# Patient Record
Sex: Female | Born: 1975 | Race: White | Hispanic: No | Marital: Married | Smoking: Never smoker
Health system: Southern US, Community
[De-identification: ages and names within clinical notes are randomized; demographics above are authoritative.]

## PROBLEM LIST (undated history)

## (undated) DIAGNOSIS — K222 Esophageal obstruction: Secondary | ICD-10-CM

## (undated) DIAGNOSIS — K8020 Calculus of gallbladder without cholecystitis without obstruction: Secondary | ICD-10-CM

## (undated) DIAGNOSIS — M1990 Unspecified osteoarthritis, unspecified site: Secondary | ICD-10-CM

## (undated) DIAGNOSIS — F419 Anxiety disorder, unspecified: Secondary | ICD-10-CM

## (undated) DIAGNOSIS — Z87448 Personal history of other diseases of urinary system: Secondary | ICD-10-CM

## (undated) DIAGNOSIS — K219 Gastro-esophageal reflux disease without esophagitis: Secondary | ICD-10-CM

## (undated) DIAGNOSIS — N6019 Diffuse cystic mastopathy of unspecified breast: Secondary | ICD-10-CM

## (undated) HISTORY — PX: LAPAROSCOPIC CHOLECYSTECTOMY: SUR755

## (undated) HISTORY — DX: Personal history of other diseases of urinary system: Z87.448

## (undated) HISTORY — DX: Esophageal obstruction: K22.2

## (undated) HISTORY — DX: Gastro-esophageal reflux disease without esophagitis: K21.9

## (undated) HISTORY — DX: Unspecified osteoarthritis, unspecified site: M19.90

## (undated) HISTORY — DX: Calculus of gallbladder without cholecystitis without obstruction: K80.20

## (undated) HISTORY — PX: ABDOMINAL EXPLORATION SURGERY: SHX538

## (undated) HISTORY — DX: Anxiety disorder, unspecified: F41.9

---

## 2003-02-06 ENCOUNTER — Ambulatory Visit (HOSPITAL_COMMUNITY): Admission: RE | Admit: 2003-02-06 | Discharge: 2003-02-06 | Payer: Self-pay | Admitting: Internal Medicine

## 2004-03-16 ENCOUNTER — Ambulatory Visit (HOSPITAL_COMMUNITY): Admission: RE | Admit: 2004-03-16 | Discharge: 2004-03-16 | Payer: Self-pay | Admitting: Urology

## 2004-03-16 ENCOUNTER — Encounter (INDEPENDENT_AMBULATORY_CARE_PROVIDER_SITE_OTHER): Payer: Self-pay | Admitting: *Deleted

## 2004-03-16 ENCOUNTER — Ambulatory Visit (HOSPITAL_BASED_OUTPATIENT_CLINIC_OR_DEPARTMENT_OTHER): Admission: RE | Admit: 2004-03-16 | Discharge: 2004-03-16 | Payer: Self-pay | Admitting: Urology

## 2004-11-27 ENCOUNTER — Emergency Department (HOSPITAL_COMMUNITY): Admission: EM | Admit: 2004-11-27 | Discharge: 2004-11-27 | Payer: Self-pay | Admitting: Emergency Medicine

## 2005-04-18 ENCOUNTER — Emergency Department (HOSPITAL_COMMUNITY): Admission: EM | Admit: 2005-04-18 | Discharge: 2005-04-18 | Payer: Self-pay | Admitting: Emergency Medicine

## 2007-04-01 ENCOUNTER — Emergency Department (HOSPITAL_COMMUNITY): Admission: EM | Admit: 2007-04-01 | Discharge: 2007-04-01 | Payer: Self-pay | Admitting: Emergency Medicine

## 2007-04-05 HISTORY — PX: TIBIA FRACTURE SURGERY: SHX806

## 2007-09-13 ENCOUNTER — Encounter: Payer: Self-pay | Admitting: Cardiology

## 2007-10-18 ENCOUNTER — Ambulatory Visit: Payer: Self-pay | Admitting: Cardiology

## 2007-10-24 ENCOUNTER — Ambulatory Visit: Payer: Self-pay | Admitting: Cardiology

## 2007-10-24 ENCOUNTER — Encounter: Payer: Self-pay | Admitting: Cardiology

## 2009-01-16 ENCOUNTER — Encounter: Payer: Self-pay | Admitting: Cardiology

## 2010-08-17 NOTE — Assessment & Plan Note (Signed)
[facility]                          [facility] OFFICE NOTE   ZYKERA, ABELLA                    MRN:          [id number]  DATE:10/18/2007                            DOB:          Mar 22, 1976    Elisse Pennick is a pleasant 35 year old female.  Recently, a murmur  has been heard and she was referred for further evaluation.  The patient  is overweight.  She has been healthy.  She was carrying her child and  slipped on the stairs and to protect her child, she took a significant  fall, breaking her leg.  This was repaired and she is stabilized.  She  is not having any chest pain.  She has no major shortness of breath or  symptoms.  With a murmur being heard, she was referred for further  evaluation.   ALLERGIES:  No known drug allergies.   MEDICATIONS:  Prilosec.   OTHER MEDICAL PROBLEMS:  See the list below.   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  Unfortunately, her husband has just lost his job.  She does not smoke.   FAMILY HISTORY:  There is no strong family history of congenital heart  disease or coronary disease.   REVIEW OF SYSTEMS:  Negative.   PHYSICAL EXAMINATION:  VITAL SIGNS:  Blood pressure is 127/78 with a  pulse of 81.  GENERAL:  The patient is oriented to person, time, and place.  Affect is  normal.  HEENT:  No xanthelasma.  She has normal extraocular motion.  NECK:  There are no carotid bruits.  There is no jugular venous  distention.  LUNGS:  Clear.  Respiratory effort is not labored.  CARDIAC:  S1 with an S2.  I hear a 2/6 murmur.  I do not hear any clicks  or rubs.  ABDOMEN:  Obese, but soft.  EXTREMITIES:  She had no peripheral edema.   EKG reveals slight decreased anterior R-wave progression.  I suspect  that this is not a significant problem.   PROBLEMS:  1. History of leg surgery from her injury.  2. C-section and an esophageal stretch in the past.  3. Question of a soft  systolic murmur.  4. Question of abnormal EKG.  At this point, I doubt significant      cardiac pathology.  However,      we will proceed with a 2-D echo to be sure that she has normal left      ventricular function and no significant valvular abnormalities.  I      will then see her back to discuss this further.     Luis Abed, MD, [facility]  Electronically Signed    JDK/MedQ  DD: 10/18/2007  DT: 10/19/2007  Job #: [id number]   cc:   Steward Drone @ [facility]

## 2010-08-20 NOTE — Op Note (Signed)
[name], [name]              ACCOUNT NO.:  [id number]   MEDICAL RECORD NO.:  [id number]          PATIENT TYPE:  AMB   LOCATION:  [facility]                         FACILITY:  [facility]   PHYSICIAN:  [name] [name], M.D.  DATE OF BIRTH:  12-07-1975   DATE OF PROCEDURE:  03/16/2004  DATE OF DISCHARGE:                                 OPERATIVE REPORT   PREOPERATIVE DIAGNOSIS:  Pelvic pain, rule out interstitial cystitis.   POSTOPERATIVE DIAGNOSIS:  Pelvic pain, rule out interstitial cystitis.   PROCEDURE:  Cystoscopy, urethral calibration, hydrodistention of the  bladder, Marcaine and Pyridium instillation, Marcaine and Kenalog injection,  bladder biopsy.   SURGEON:  Dr. Logan [name].   ANESTHESIA:  General.   COMPLICATIONS:  None.   DRAINS:  None.   BRIEF HISTORY:  This is 35 year old female who has had long standing  problems with burning and pain in the lower urinary tract. The patient was  evaluated a number of years ago by Dr. Bjorn [name] who thought she had  urethral syndrome/urethral stenosis and performed a dilation. The records  indicate that the patient had some response to this, but in her memory, this  was not particularly helpful. The patient was seen and evaluated by me in  late November at which time she had a completely normal urinalysis. Despite  that was quite symptomatic. Her examination showed classic pelvic pain in  the bladder area. The patient was given several options for evaluation  including potassium testing or an anesthetic challenge and elected to  undergo cystoscopy and hydrodistention. The patient knows there is a chance  that this will help her symptoms, but certainly there is no guarantee that  that will occur, and if it does happen, there is no guarantee that there  will be long term improvement. She is also aware of the fact that initially  her bladder will feel somewhat worse. The patient agrees to diagnostic  cystoscopy and hydrodistention and has also  requested that a biopsy be  performed. She understands the risks and benefits of the procedure and gave  full informed consent.   PROCEDURE:  After successful induction of general anesthesia, the patient  was placed in a dorsal lithotomy position, prepped with Betadine, and draped  in the usual sterile fashion. Careful bimanual examination was unremarkable.  The external genitalia were normal. The patient had no cystocele, rectocele,  or enterocele. The vault was well supported. The urethra was unremarkable.  There was no signs of a diverticula or other abnormalities. The urethra was  calibrated at 14 [place] with female urethral sounds with no evidence of  stenosis or stricture. Cystoscope was inserted. The bladder was carefully  inspected. It was free of any tumors or stones. Both ureteral orifices were  normal in configuration and location. Clear urine was seen to efflux from  each. The bladder was drained after having been filled up to 100 cm of  pressure for 5 minutes. When the bladder was drained, it was remarkable for  the fact that in essence no glomerulations or ulcers could be seen. The  patient had normal bladder capacity  of 1,200 cc. There was normal terminal  bleeding at the end of the drain out cycle. This was felt to be a very  normal evaluation. The patient had a bladder biopsy performed, and the  biopsy will be sent for mast cell analysis. The biopsy site was cauterized  with a Bugbee electrode. The bladder was drained. A mixture of Marcaine and  Pyridium was left in the bladder, and Marcaine and Kenalog were injected  periurethrally. The patient received intraoperative B&O suppository,  Toradol, and Zofran. She tolerated the procedure well and was taken to  recovery room in good condition. She will be sent home with Pyridium Plus,  Macrodantin, and Lorcet Plus. In terms of the Macrodantin, she will be asked  to take this twice a day for 3 days and then once a day, and we  will  determine if long term antibiotics suppression helps her symptoms  whatsoever. If the patient gets remarkable response to the hydrodistention,  then certainly one would have to entertain the possibility that this is an  atypical form of interstitial cystitis and consider treatment along that  line. Obviously, potassium testing or anesthetic challenge could be  considered for additional diagnostic testing as well. If on the other hand,  the patient has absolutely no change in her symptoms, I think the optimal  approach would be antibiotic suppression and simple symptomatic management  which certainly would include urinary analgesia such as the Pyridium Plus  but may also include bladder antispasmodics, skeletal muscle relaxants, and  possible physical therapy.      RJE/MEDQ  D:  03/16/2004  T:  03/16/2004  Job:  [id number]

## 2010-08-20 NOTE — Op Note (Signed)
NAME:  [name] [name], [name] [name]                        ACCOUNT NO.:  [id number]   MEDICAL RECORD NO.:  [id number]                   PATIENT TYPE:  AMB   LOCATION:  DAY                                  FACILITY:  [facility]   PHYSICIAN:  Lionel December, M.D.                 DATE OF BIRTH:  03-14-1976   DATE OF PROCEDURE:  02/06/2003  DATE OF DISCHARGE:                                 OPERATIVE REPORT   PROCEDURE:  Esophagogastroduodenoscopy with esophageal dilatation.   INDICATIONS FOR PROCEDURE:  [name] [name] is a 35 year old Caucasian female with  symptoms of chronic GERD who is presently on a PPI and promotility agent.  She also complains of solid food dysphagia and epigastric discomfort. Upper  abdominal ultrasound has been negative. She is undergoing  diagnostic/therapeutic procedure. The procedure was reviewed with the  patient and informed consent was obtained.   PREOP MEDICATIONS:  Cetacaine spray for pharyngeal topical anesthesia,  Demerol 50 mg IV, Versed 15 mg IV.   FINDINGS:  The procedure performed in endoscopy suite. The patient's vital  signs and O2 sat were monitored during the procedure and remained stable.  The patient was placed in the left lateral decubitus position and Olympus  videoscope was passed through oropharynx without any difficulty into the  esophagus.   ESOPHAGUS:  The mucosa of the esophagus normal. There was no ring or  stricture formation. The GE junction was wavy. There was a 3 cm long sliding  hiatal hernia.   STOMACH:  It was empty and distended very well with insufflation. Folds of  the proximal stomach were normal. Examination of the mucosa revealed patchy  erythema at the antrum but no erosions or ulcers noted. The pyloric channel  was patent. The angularis, fundus, and cardia were examined by retroflexing  the scope and were normal.   DUODENUM:  Examination of the bulb and second part of the duodenum was  normal. The endoscope was withdrawn.   The  esophagus was dilated by passing a 56 [place] Maloney dilator through the  esophagus. As the dilator was withdrawn, the endoscope was passed again and  esophagus reexamined and there was no mucosal injury. The endoscope was  withdrawn. The patient tolerated the procedure well.   FINAL DIAGNOSES:  1. Small sliding hiatal hernia.  2. Nonerosive antral gastritis.  3. Esophagus dilated by passing a 56 French dilator given his fear of     dysphagia.   RECOMMENDATIONS:  1. H. pylori serology will be checked today.  2.     She will continue antireflux measures and Protonix as before.  3. I would like to drop her Reglan to her 5 mg before breakfast and before     evening meal.  4. She will return for office visit in three months from now.      ___________________________________________  Lionel December, M.D.   NR/MEDQ  D:  02/06/2003  T:  02/06/2003  Job:  [id number]   cc:   Gaynelle Cage, MD  [phone] [address]  [place]  [place] [address]  Fax: [phone]

## 2010-08-20 NOTE — Consult Note (Signed)
NAMEJAYDON, SOROKA                          ACCOUNT NO.:  [id number]   MEDICAL RECORD NO.:  [id number]                  PATIENT TYPE:   LOCATION:                                       FACILITY:   PHYSICIAN:  Lionel December, M.D.                 DATE OF BIRTH:  Feb 17, 1976   DATE OF CONSULTATION:  02/03/2003  DATE OF DISCHARGE:                                   CONSULTATION   REFERRING PHYSICIAN:  Gaynelle Cage, M.D.   REASON FOR CONSULTATION:  Acid reflux, epigastric pain.   HISTORY OF PRESENT ILLNESS:  Clelia is a 35 year old Caucasian female with  one-year history of acid reflux disease.  It has been worsening over the  last several months to weeks.  She has typical reflux symptoms in the way of  heartburn, but also complains of intermittent epigastric pain, but with  constant epigastric pressure, acid regurgitation.  Denies any nausea or  vomiting.  She does have dysphagia to solids foods which she has had for 4-5  months.  Stools have been darker recently.  She denies any fresh blood.  She  has intermittent diarrhea for many years.  She notices this when she gets  dressed.  She has been tried on Prevacid, Nexium, and Zantac in the past,  with little relief.  Currently, she is on Protonix and Reglan which seems to  have some improvement over typical reflux symptoms.  She still has  epigastric pain and hoarseness.  She has had two emergency room visits  because of her symptoms.  She was recently on Advil p.r.n., but was asked to  discontinue this by Dr. Reola Calkins recently.   CURRENT MEDICATIONS:  1. Protonix 40 mg daily.  2. Metoclopramide 5 mg q.a.c./q.h.s.  3. Yasmin oral birth control daily.  4. Lexapro 10 mg daily.  5. Tylenol p.r.n.   ALLERGIES:  No known drug allergies.   PAST MEDICAL HISTORY:  Gastroesophageal reflux disease.  Anxiety.   FAMILY HISTORY:  Father has a history of gastric ulcer.  No family history  of colorectal cancer to her knowledge.   SOCIAL  HISTORY:  She is single and has no children.  She is employed as a  Runner, broadcasting/film/video at [employer].  She has never been a smoker.  She denies any alcohol use.   REVIEW OF SYSTEMS:  Please see HPI for GI.  GENERAL:  According to Dr.  Marty Heck note, there has been a reported nine-pound weight loss.  The patient  states that she has gained a couple of pounds back.  CARDIOPULMONARY:  Denies any chest pain or shortness of breath.   PHYSICAL EXAMINATION:  VITAL SIGNS:  Weight 172, height 5 feet 11 inches,  temperature 98.2, blood pressure 98/70, pulse 76.  GENERAL:  A pleasant, well-nourished, well-developed Caucasian female in no  acute distress.  SKIN:  Warm and dry, no jaundice.  HEENT:  Conjunctivae  were pink, sclerae nonicteric.  Oropharyngeal mucosa  moist and pink.  No lesions, erythema, or exudate.  No lymphadenopathy or  thyromegaly.  CHEST:  Lungs are clear to auscultation.  CARDIAC:  Reveals regular rate and rhythm, normal S1, S2.  No murmur, rub,  or gallops.  ABDOMEN:  Positive bowel sounds, soft, nondistended.  Mild epigastric  tenderness to deep palpation.  No organomegaly or masses.  EXTREMITIES:  No edema.   Abdominal ultrasound on January 09, 2003, was normal.  She had no LFTs and  hemoglobin and hematocrit, in October as well.  Negative Helicobacter  pylori.   IMPRESSION:  The patient is a pleasant 35 year old lady with a one-year  history of worsening gastroesophageal reflux disease.  She is also having  epigastric pain and solid food esophageal dysphagia.  The symptoms may be  secondary to poorly controlled reflux and/or esophageal stricture, however,  given her recent NSAID use would like to rule out peptic ulcer disease as  well.   PLAN:  1. Upper endoscopy with possible dilatation in the near future.  2. She will continue Protonix and Reglan for now.  3. She will continue to avoid NSAIDs and aspirin for now.   I would like to thank Dr. Gaynelle Cage  for allowing [place] to take part in the care  of this patient.     ________________________________________  ___________________________________________  Tana Coast, P.A.                         Lionel December, M.D.   LL/MEDQ  D:  02/03/2003  T:  02/03/2003  Job:  [id number]

## 2016-04-28 ENCOUNTER — Encounter (INDEPENDENT_AMBULATORY_CARE_PROVIDER_SITE_OTHER): Payer: Self-pay | Admitting: Orthopaedic Surgery

## 2016-04-28 ENCOUNTER — Ambulatory Visit (INDEPENDENT_AMBULATORY_CARE_PROVIDER_SITE_OTHER): Payer: Self-pay

## 2016-04-28 ENCOUNTER — Ambulatory Visit (INDEPENDENT_AMBULATORY_CARE_PROVIDER_SITE_OTHER): Payer: BLUE CROSS/BLUE SHIELD | Admitting: Orthopaedic Surgery

## 2016-04-28 VITALS — BP 147/96 | HR 96 | Ht 69.0 in | Wt 260.0 lb

## 2016-04-28 DIAGNOSIS — M542 Cervicalgia: Secondary | ICD-10-CM

## 2016-04-28 DIAGNOSIS — M79641 Pain in right hand: Secondary | ICD-10-CM | POA: Diagnosis not present

## 2016-04-28 NOTE — Progress Notes (Addendum)
Office Visit Note   Patient: [name] HalimJessica A Gautreau           Date of Birth: 05/03/1975           MRN: [id number] Visit Date: 04/28/2016              Requested by: No referring provider defined for this encounter. PCP: No primary care provider on file.   Assessment & Plan: Visit Diagnoses:  1. Neck pain   2. Pain in right hand   3.      Patient's findings are consistent with right carpal tunnel syndrome. She may have double crush syndrome with the cervical spondylosis present on plain radiographs and family history of spondylosis with the mother said previous cervical fusion. Posterior symptoms are in her hand and sometimes radiating into the forearm. We'll proceed with the EMGs nerve conduction velocities evaluate her for carpal tunnel syndrome versus spondylosis and office follow-up after tests.  Plan: Patient can work with the wrist splinting. She has cervical spondylosis immobilizer prednisone pack. Will check her back in 4 weeks. If she is having persistent symptoms we'll need to consider diagnostic imaging.  Follow-Up Instructions: Return in about 4 weeks (around 05/26/2016).   Orders:  Orders Placed This Encounter  Procedures  . XR Cervical Spine 2 or 3 views  . XR Hand Complete Right  . Ambulatory referral to Physical Medicine Rehab   No orders of the defined types were placed in this encounter.     Procedures: No procedures performed   Clinical Data: No additional findings.   Subjective: Chief Complaint  Patient presents with  . Right Elbow - Pain  . Right Wrist - Pain    Patient presents with right elbow and wrist pain. She states that it wakes her at night. She does have some tingling, and hot and cold sensation in her fingers. She has had these symptoms since 2007, but they have been worse recently. She wears a wrist brace.    Review of Systems  Constitutional: Negative for chills and diaphoresis.       Patient works as a Lawyersubstitute teacher.  HENT:  Negative for ear discharge, ear pain and nosebleeds.   Eyes: Negative for discharge and visual disturbance.  Respiratory: Negative for cough, choking and shortness of breath.   Cardiovascular: Negative for chest pain and palpitations.  Gastrointestinal: Negative for abdominal distention and abdominal pain.       Previous gallbladder removal February 2017. Positive for acid reflux past bladder problems. Negative workup for interstitial cystitis.  Endocrine: Negative for cold intolerance and heat intolerance.  Genitourinary: Negative for flank pain and hematuria.  Musculoskeletal:       Several months history of the right hand numbness with pain that wakes her up at night.  Skin: Negative for rash and wound.  Neurological: Negative for seizures and speech difficulty.  Hematological: Negative for adenopathy. Does not bruise/bleed easily.  Psychiatric/Behavioral: Negative for agitation and suicidal ideas.     Objective: Vital Signs: BP (!) 147/96   Pulse 96   Ht 5\' 9"  (1.753 m)   Wt 260 lb (117.9 kg)   BMI 38.40 kg/m   Physical Exam  Constitutional: She is oriented to person, place, and time. She appears well-developed.  HENT:  Head: Normocephalic.  Right Ear: External ear normal.  Left Ear: External ear normal.  Eyes: Pupils are equal, round, and reactive to light.  Neck: No tracheal deviation present. No thyromegaly present.  Cardiovascular: Normal rate.  Pulmonary/Chest: Effort normal.  Abdominal: Soft.  Musculoskeletal:  Positive Phalen's positive carpal compression test on the right. She has slight help hyperthenar atrophy on the right comparison normal on the left negative Phalen's on the left. Brachial plexus tenderness is present worse in the right minimal left. Mildly positive to moderately positive Spurling on the right negative on the left. Some increased discomfort with cervical compression no change with distraction. Lower extremity reflexes 2+ and symmetrical isolated  motor weakness upper extremities. Biceps triceps was flexion-extension is normal to testing.  Neurological: She is alert and oriented to person, place, and time.  Skin: Skin is warm and dry.  Psychiatric: She has a normal mood and affect. Her behavior is normal.    Ortho Exam  Specialty Comments:  No specialty comments available.  Imaging: Xr Cervical Spine 2 Or 3 Views  Result Date: 04/29/2016 [facility] lateral C-spine x-rays obtained and reviewed. This shows some mid cervical spurring some loss of disc space height primarily C5-6 C6-7. Impression: Mild cervical spondylosis no spondylolisthesis no congenital fusions  Xr Hand Complete Right  Result Date: 04/29/2016 [facility] and lateral right wrist views and carpal tunnel view are obtained and reviewed. This shows normal bone anatomy. No spurring in the carpal canal no evidence of previous fracture. Impression: Normal wrist x-rays, right wrist    PMFS History: There are no active problems to display for this patient.  Past Medical History:  Diagnosis Date  . Acid reflux   . Arthritis   . History of bladder problems     No family history on file.  Past Surgical History:  Procedure Laterality Date  . LAPAROSCOPIC CHOLECYSTECTOMY     Social History   Occupational History  . substitute teacher    Social History Main Topics  . Smoking status: Never Smoker  . Smokeless tobacco: Never Used  . Alcohol use No  . Drug use: No  . Sexual activity: Not on file

## 2016-05-05 ENCOUNTER — Telehealth (INDEPENDENT_AMBULATORY_CARE_PROVIDER_SITE_OTHER): Payer: Self-pay | Admitting: Radiology

## 2016-05-05 NOTE — Telephone Encounter (Signed)
Called pt on this # as it is the correct phone #. # on order was incorrect. Updated demographics with correct #. Scheduled her for 05/12/16 @ 10:30.

## 2016-05-05 NOTE — Telephone Encounter (Signed)
Patient left message at [facility].  Can you please give her an update on her referral for EMG/NCS.  She states that she has not heard from anyone yet. Thanks. CB [phone]

## 2016-05-12 ENCOUNTER — Ambulatory Visit (INDEPENDENT_AMBULATORY_CARE_PROVIDER_SITE_OTHER): Payer: BLUE CROSS/BLUE SHIELD | Admitting: Physical Medicine and Rehabilitation

## 2016-05-12 DIAGNOSIS — R202 Paresthesia of skin: Secondary | ICD-10-CM | POA: Diagnosis not present

## 2016-05-12 NOTE — Progress Notes (Signed)
[name] [name] - 41 y.o. female MRN [id number]  Date of birth: Apr 16, 1975  Office Visit Note: Visit Date: 05/12/2016 [name]: [name] [name] Referred by: [name] Manges, MD  Subjective: Chief Complaint  Patient presents with  . Right Wrist - Pain   HPI: [name] [name] is a 41 year old right-hand dominant female who reports worsening pain in the right wrist up to the elbow. She gets pain and tingling in the fingers really nondermatomal leg. She had she states that she woke up this morning in her right thumb was numb and this was something new for her and it stayed numb. It is really only on the ulnar side of the thumb. She reports that every time the symptoms recur she's had these since 2007 that they seem to worsen and last longer. She has not had an MRI of the cervical spine. Cervical spine x-rays through [name] [name] did show some degenerative changes at C5-C6 and C6-7. She had normal x-rays of the right hand and wrist. She has not had electrodiagnostic studies in the past. She denies left-sided symptoms.     ROS Otherwise per HPI.  Assessment & Plan: Visit Diagnoses:  1. Paresthesia of skin     Plan: Findings:  Impression: Essentially NORMAL electrodiagnostic study of the right upper limb.  There is no significant electrodiagnostic evidence of nerve entrapment,brachial plexopathy or cervical radiculopathy.    As you know, purely sensory or demyelinating radiculopathies and chemical radiculitis may not be detected with this particular electrodiagnostic study.  Recommendations: 1.  Follow-up with referring physician. 2.  Continue current management of symptoms.     Meds & Orders: No orders of the defined types were placed in this encounter.   Orders Placed This Encounter  Procedures  . NCV with EMG (electromyography)    Follow-up: Return for Scheduled follow-up with [name] [name].   Procedures: No procedures performed  Impression: Essentially NORMAL electrodiagnostic study of  the right upper limb.  There is no significant electrodiagnostic evidence of nerve entrapment,brachial plexopathy or cervical radiculopathy.    As you know, purely sensory or demyelinating radiculopathies and chemical radiculitis may not be detected with this particular electrodiagnostic study.  Recommendations: 1.  Follow-up with referring physician. 2.  Continue current management of symptoms.   Nerve Conduction Studies Anti Sensory Summary Table   Stim Site NR Peak (ms) Norm Peak (ms) P-T Amp (V) Norm P-T Amp Site1 Site2 Delta-P (ms) Dist (cm) Vel (m/s) Norm Vel (m/s)  Right Median Acr Palm Anti Sensory (2nd Digit)  31.9C  Wrist    3.2 <3.6 45.5 >10 Wrist Palm 1.4 0.0    Palm    1.8 <2.0 60.3         Right Radial Anti Sensory (Base 1st Digit)  32.5C  Wrist    2.1 <3.1 36.7  Wrist Base 1st Digit 2.1 0.0    Right Ulnar Anti Sensory (5th Digit)  32C  Wrist    3.4 <3.7 30.4 >15.0 Wrist 5th Digit 3.4 14.0 41 >38   Motor Summary Table   Stim Site NR Onset (ms) Norm Onset (ms) O-P Amp (mV) Norm O-P Amp Site1 Site2 Delta-0 (ms) Dist (cm) Vel (m/s) Norm Vel (m/s)  Right Median Motor (Abd Poll Brev)  32.4C  Wrist    3.0 <4.2 7.5 >5 Elbow Wrist 3.6 20.0 56 >50  Elbow    6.6  8.0         Right Ulnar Motor (Abd Dig Min)  32.3C  Wrist  3.1 <4.2 6.0 >3 B Elbow Wrist 2.8 20.0 71 >53  B Elbow    5.9  7.6  A Elbow B Elbow 1.3 10.0 77 >53  A Elbow    7.2  7.7          EMG   Side Muscle Nerve Root Ins Act Fibs Psw Amp Dur Poly Recrt Int Dennie Bible Comment  Right Abd Poll Brev Median C8-T1 Nml Nml Nml Nml Nml 0 Nml Nml   Right 1stDorInt Ulnar C8-T1 Nml Nml Nml Nml Nml 0 Nml Nml   Right PronatorTeres Median C6-7 Nml Nml Nml Nml Nml 0 Nml Nml   Right Biceps Musculocut C5-6 Nml Nml Nml Nml Nml 0 Nml Nml   Right Deltoid Axillary C5-6 Nml Nml Nml Nml Nml 0 Nml Nml     Nerve Conduction Studies Anti Sensory Left/Right Comparison   Stim Site L Lat (ms) R Lat (ms) L-R Lat (ms) L Amp (V) R Amp  (V) L-R Amp (%) Site1 Site2 L Vel (m/s) R Vel (m/s) L-R Vel (m/s)  Median Acr Palm Anti Sensory (2nd Digit)  31.9C  Wrist  3.2   45.5  Wrist Palm     Palm  1.8   60.3        Radial Anti Sensory (Base 1st Digit)  32.5C  Wrist  2.1   36.7  Wrist Base 1st Digit     Ulnar Anti Sensory (5th Digit)  32C  Wrist  3.4   30.4  Wrist 5th Digit  41    Motor Left/Right Comparison   Stim Site L Lat (ms) R Lat (ms) L-R Lat (ms) L Amp (mV) R Amp (mV) L-R Amp (%) Site1 Site2 L Vel (m/s) R Vel (m/s) L-R Vel (m/s)  Median Motor (Abd Poll Brev)  32.4C  Wrist  3.0   7.5  Elbow Wrist  56   Elbow  6.6   8.0        Ulnar Motor (Abd Dig Min)  32.3C  Wrist  3.1   6.0  B Elbow Wrist  71   B Elbow  5.9   7.6  A Elbow B Elbow  77   A Elbow  7.2   7.7              Clinical History: No specialty comments available.  She reports that she has never smoked. She has never used smokeless tobacco. No results for input(s): HGBA1C, LABURIC in the last 8760 hours.  Objective:  VS:  HT:    WT:   BMI:     BP:   HR: bpm  TEMP: ( )  RESP:  Physical Exam  Musculoskeletal:  Inspection reveals no atrophy of the bilateral APB or FDI or hand intrinsics. There is no swelling, color changes, allodynia or dystrophic changes. There is 5 out of 5 strength in the bilateral wrist extension, finger abduction and long finger flexion.     Ortho Exam Imaging: No results found.  Past Medical/Family/Surgical/Social History: Medications & Allergies reviewed per EMR There are no active problems to display for this patient.  Past Medical History:  Diagnosis Date  . Acid reflux   . Arthritis   . History of bladder problems    No family history on file. Past Surgical History:  Procedure Laterality Date  . LAPAROSCOPIC CHOLECYSTECTOMY     Social History   Occupational History  . substitute teacher    Social History Main Topics  . Smoking status: Never Smoker  .  Smokeless tobacco: Never Used  . Alcohol use No  .  Drug use: No  . Sexual activity: Not on file

## 2016-05-16 NOTE — Procedures (Signed)
Impression: Essentially NORMAL electrodiagnostic study of the right upper limb.  There is no significant electrodiagnostic evidence of nerve entrapment,brachial plexopathy or cervical radiculopathy.    As you know, purely sensory or demyelinating radiculopathies and chemical radiculitis may not be detected with this particular electrodiagnostic study.  Recommendations: 1.  Follow-up with referring physician. 2.  Continue current management of symptoms.   Nerve Conduction Studies Anti Sensory Summary Table   Stim Site NR Peak (ms) Norm Peak (ms) P-T Amp (V) Norm P-T Amp Site1 Site2 Delta-P (ms) Dist (cm) Vel (m/s) Norm Vel (m/s)  Right Median Acr Palm Anti Sensory (2nd Digit)  31.9C  Wrist    3.2 <3.6 45.5 >10 Wrist Palm 1.4 0.0    Palm    1.8 <2.0 60.3         Right Radial Anti Sensory (Base 1st Digit)  32.5C  Wrist    2.1 <3.1 36.7  Wrist Base 1st Digit 2.1 0.0    Right Ulnar Anti Sensory (5th Digit)  32C  Wrist    3.4 <3.7 30.4 >15.0 Wrist 5th Digit 3.4 14.0 41 >38   Motor Summary Table   Stim Site NR Onset (ms) Norm Onset (ms) O-P Amp (mV) Norm O-P Amp Site1 Site2 Delta-0 (ms) Dist (cm) Vel (m/s) Norm Vel (m/s)  Right Median Motor (Abd Poll Brev)  32.4C  Wrist    3.0 <4.2 7.5 >5 Elbow Wrist 3.6 20.0 56 >50  Elbow    6.6  8.0         Right Ulnar Motor (Abd Dig Min)  32.3C  Wrist    3.1 <4.2 6.0 >3 B Elbow Wrist 2.8 20.0 71 >53  B Elbow    5.9  7.6  A Elbow B Elbow 1.3 10.0 77 >53  A Elbow    7.2  7.7          EMG   Side Muscle Nerve Root Ins Act Fibs Psw Amp Dur Poly Recrt Int Dennie Bible Comment  Right Abd Poll Brev Median C8-T1 Nml Nml Nml Nml Nml 0 Nml Nml   Right 1stDorInt Ulnar C8-T1 Nml Nml Nml Nml Nml 0 Nml Nml   Right PronatorTeres Median C6-7 Nml Nml Nml Nml Nml 0 Nml Nml   Right Biceps Musculocut C5-6 Nml Nml Nml Nml Nml 0 Nml Nml   Right Deltoid Axillary C5-6 Nml Nml Nml Nml Nml 0 Nml Nml     Nerve Conduction Studies Anti Sensory Left/Right Comparison   Stim  Site L Lat (ms) R Lat (ms) L-R Lat (ms) L Amp (V) R Amp (V) L-R Amp (%) Site1 Site2 L Vel (m/s) R Vel (m/s) L-R Vel (m/s)  Median Acr Palm Anti Sensory (2nd Digit)  31.9C  Wrist  3.2   45.5  Wrist Palm     Palm  1.8   60.3        Radial Anti Sensory (Base 1st Digit)  32.5C  Wrist  2.1   36.7  Wrist Base 1st Digit     Ulnar Anti Sensory (5th Digit)  32C  Wrist  3.4   30.4  Wrist 5th Digit  41    Motor Left/Right Comparison   Stim Site L Lat (ms) R Lat (ms) L-R Lat (ms) L Amp (mV) R Amp (mV) L-R Amp (%) Site1 Site2 L Vel (m/s) R Vel (m/s) L-R Vel (m/s)  Median Motor (Abd Poll Brev)  32.4C  Wrist  3.0   7.5  Elbow Wrist  56   Elbow  6.6   8.0        Ulnar Motor (Abd Dig Min)  32.3C  Wrist  3.1   6.0  B Elbow Wrist  71   B Elbow  5.9   7.6  A Elbow B Elbow  77   A Elbow  7.2   7.7

## 2016-05-18 ENCOUNTER — Ambulatory Visit (INDEPENDENT_AMBULATORY_CARE_PROVIDER_SITE_OTHER): Payer: BLUE CROSS/BLUE SHIELD | Admitting: Orthopaedic Surgery

## 2016-05-26 ENCOUNTER — Encounter (INDEPENDENT_AMBULATORY_CARE_PROVIDER_SITE_OTHER): Payer: Self-pay | Admitting: Orthopaedic Surgery

## 2016-05-26 ENCOUNTER — Ambulatory Visit (INDEPENDENT_AMBULATORY_CARE_PROVIDER_SITE_OTHER): Payer: BLUE CROSS/BLUE SHIELD | Admitting: Orthopaedic Surgery

## 2016-05-26 VITALS — BP 143/92 | HR 80 | Ht 69.0 in | Wt 260.0 lb

## 2016-05-26 DIAGNOSIS — M545 Low back pain, unspecified: Secondary | ICD-10-CM

## 2016-05-26 DIAGNOSIS — M542 Cervicalgia: Secondary | ICD-10-CM | POA: Diagnosis not present

## 2016-05-26 DIAGNOSIS — G8929 Other chronic pain: Secondary | ICD-10-CM

## 2016-05-26 NOTE — Progress Notes (Signed)
Office Visit Note   Patient: [name] [name]           Date of Birth: Oct 10, 1975           MRN: [id number] Visit Date: 05/26/2016              Requested by: No referring provider defined for this encounter. PCP: Pcp Not In System   Assessment & Plan: Visit Diagnoses:  1. Neck pain   2. Chronic bilateral low back pain without sciatica     Plan: Reviewed the electrode just to give her copy she has essentially normal EMGs nerve connection last week. She does have spondylosis in the cervical spine which may be giving her some symptoms. We discussed working on some activities that will reduce stress exercise program that she could do with her family. Weight loss, health maintenance, stress reduction activities. At this point no indication for cervical intervention in the cervical spine or the carpal tunnel. She can return if she has increased symptoms. I think with some exercise activity she should gradually get improvement in her symptoms. Husband is present today we discussed this is a family activity they can work on to improve her health symptoms and questions about this were elicited and answered. She can return if needed and has increased symptoms.  Follow-Up Instructions: Return if symptoms worsen or fail to improve.   Orders:  No orders of the defined types were placed in this encounter.  No orders of the defined types were placed in this encounter.     Procedures: No procedures performed   Clinical Data: No additional findings.   Subjective: Chief Complaint  Patient presents with  . Neck - Pain  . Right Hand - Numbness, Pain  . Left Hand - Numbness, Pain    Patient returns to review EMG/NCS. Since last appointment in the office, she has started having symptoms in the left upper extremity as well. She is having tingling in her left hand, worse from the middle finger to the thumb. She describes this as intense. She also has developed neck pain and headaches. The  numbness and tingling on the right is unchanged.   Previous x-ray chest shows some loss of disc space side at C5-6 C6-7. Her exam suggested potential for carpal tunnel problems. EMGs nerve conduction velocities are essentially normal.  Review of Systems 14 point review of systems updated and are unchanged from 04/28/2016. 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 She is here with her husband today. History of acid reflux previous back problems. Problems with the pain in her right arm right elbow sometimes under wrist sometimes wakes her up at night occasionally feeling cold or hot in her fingers sometimes in her left arm. She's used a wrist brace taken anti-inflammatories.   Objective: Vital Signs: BP (!) 143/92   Pulse 80   Ht 5\' 9"  (1.753 m)   Wt 260 lb (117.9 kg)   BMI 38.40 kg/m   Physical Exam  Constitutional: She is oriented to person, place, and time. She appears well-developed.  HENT:  Head: Normocephalic.  Right Ear: External ear normal.  Left Ear: External ear normal.  Eyes: Pupils are equal, round, and reactive to light.  Neck: No tracheal deviation present. No thyromegaly present.  Cardiovascular: Normal rate.   Pulmonary/Chest: Effort normal.  Abdominal: Soft.  Musculoskeletal:  Patient is a less tenderness over carpal tunnel right and left. Some tenderness palpation over the cervical spine, brachial plexus right and left. Shoulder impingement.  Ulnar nerve in the cubital tunnel is normal bilaterally. No muscle atrophy.  Neurological: She is alert and oriented to person, place, and time.  Skin: Skin is warm and dry.  Psychiatric: She has a normal mood and affect. Her behavior is normal.    Ortho Exam  Specialty Comments:  No specialty comments available.  Imaging: No results found.   PMFS History: Patient Active Problem List   Diagnosis Date Noted  . Neck pain 05/26/2016   Past Medical History:  Diagnosis Date  . Acid reflux   . Arthritis   .  History of bladder problems     No family history on file.  Past Surgical History:  Procedure Laterality Date  . LAPAROSCOPIC CHOLECYSTECTOMY     Social History   Occupational History  . substitute teacher    Social History Main Topics  . Smoking status: Never Smoker  . Smokeless tobacco: Never Used  . Alcohol use No  . Drug use: No  . Sexual activity: Not on file

## 2018-04-20 ENCOUNTER — Encounter: Payer: Self-pay | Admitting: Gastroenterology

## 2018-05-10 ENCOUNTER — Encounter: Payer: Self-pay | Admitting: Gastroenterology

## 2018-05-29 ENCOUNTER — Ambulatory Visit (INDEPENDENT_AMBULATORY_CARE_PROVIDER_SITE_OTHER): Payer: BLUE CROSS/BLUE SHIELD | Admitting: Gastroenterology

## 2018-05-29 ENCOUNTER — Encounter (INDEPENDENT_AMBULATORY_CARE_PROVIDER_SITE_OTHER): Payer: Self-pay

## 2018-05-29 ENCOUNTER — Encounter: Payer: Self-pay | Admitting: Gastroenterology

## 2018-05-29 VITALS — BP 146/86 | HR 92 | Ht 69.0 in | Wt 262.2 lb

## 2018-05-29 DIAGNOSIS — R1310 Dysphagia, unspecified: Secondary | ICD-10-CM

## 2018-05-29 DIAGNOSIS — R1010 Upper abdominal pain, unspecified: Secondary | ICD-10-CM | POA: Diagnosis not present

## 2018-05-29 DIAGNOSIS — R1319 Other dysphagia: Secondary | ICD-10-CM

## 2018-05-29 DIAGNOSIS — R140 Abdominal distension (gaseous): Secondary | ICD-10-CM | POA: Diagnosis not present

## 2018-05-29 DIAGNOSIS — R142 Eructation: Secondary | ICD-10-CM | POA: Diagnosis not present

## 2018-05-29 NOTE — Progress Notes (Signed)
[facility] Consult Note:  History: [name] [name] 05/29/2018  Referring physician: Kirstie Peri, MD  Reason for consult/chief complaint: Gastroesophageal Reflux (x years; starting in December was dx with gastritis- took advil regularly until December (now off); sometimes has "ball of pain at end of sternum"; seems worse with stress; has hx of esophageal stricture with dilation many years ago); change in bowels (alternating constipation, diarrhea, bloating; has noted rectal bleeding at times but attributes to hemorrhoids from giving birth); and Abdominal Pain (can occur in epigastrium or either side of ribcage)   Subjective  HPI:  This is a very pleasant 43 year old woman referred by primary care in [facility] for recent onset upper abdominal pain.  She has longstanding irregular bowel habits and occasional hemorrhoidal bleeding.  Starting Christmas Day she had acute onset upper abdominal pain with protracted vomiting.  Symptoms slowly subsided but have not resolved.  She has persistent upper abdominal bloating and pressure with foul-smelling belching nonexertional chest discomfort.  She feels like there is a "ball of pain at the end of the sternum".  It is sometimes worse with meals, definitely worse with stress.  She reports an upper endoscopy perhaps 15 years ago and believes she had a hiatal hernia and may be an esophageal dilation.   ROS:  Review of Systems  Constitutional: Positive for fatigue. Negative for appetite change and unexpected weight change.  HENT: Negative for mouth sores and voice change.   Eyes: Negative for pain and redness.  Respiratory: Positive for cough. Negative for shortness of breath.   Cardiovascular: Negative for chest pain and palpitations.  Genitourinary: Negative for dysuria and hematuria.  Musculoskeletal: Positive for arthralgias and back pain. Negative for myalgias.  Skin: Negative for pallor and rash.  Allergic/Immunologic: Positive  for environmental allergies.  Neurological: Positive for headaches. Negative for weakness.  Hematological: Negative for adenopathy.  Psychiatric/Behavioral:       Anxiety  Night sweats and sore throat as well   Past Medical History: Past Medical History:  Diagnosis Date  . Acid reflux   . Anxiety   . Arthritis   . Esophageal stricture   . Gallstones   . History of bladder problems      Past Surgical History: Past Surgical History:  Procedure Laterality Date  . ABDOMINAL EXPLORATION SURGERY     as a child  . CESAREAN SECTION  2007  . LAPAROSCOPIC CHOLECYSTECTOMY    . TIBIA FRACTURE SURGERY Right 2009     Family History: Family History  Problem Relation Age of Onset  . Lung cancer Paternal Grandmother   . Colon cancer Neg Hx   . Esophageal cancer Neg Hx   . Pancreatic cancer Neg Hx   . Liver disease Neg Hx   . Stomach cancer Neg Hx     Social History: Social History   Socioeconomic History  . Marital status: Married    Spouse name: Not on file  . Number of children: Not on file  . Years of education: Not on file  . Highest education level: Not on file  Occupational History  . Occupation: Lawyer  Social Needs  . Financial resource strain: Not on file  . Food insecurity:    Worry: Not on file    Inability: Not on file  . Transportation needs:    Medical: Not on file    Non-medical: Not on file  Tobacco Use  . Smoking status: Never Smoker  . Smokeless tobacco: Never Used  Substance and Sexual  Activity  . Alcohol use: No  . Drug use: No  . Sexual activity: Not on file  Lifestyle  . Physical activity:    Days per week: Not on file    Minutes per session: Not on file  . Stress: Not on file  Relationships  . Social connections:    Talks on phone: Not on file    Gets together: Not on file    Attends religious service: Not on file    Active member of club or organization: Not on file    Attends meetings of clubs or organizations: Not on  file    Relationship status: Not on file  Other Topics Concern  . Not on file  Social History Narrative  . Not on file    Allergies: Allergies  Allergen Reactions  . Contrast Media [Iodinated Diagnostic Agents]     Allergy to Isovue 370  . Vesicare [Solifenacin Succinate]     Outpatient Meds: Current Outpatient Medications  Medication Sig Dispense Refill  . Alum & Mag Hydroxide-Simeth (GI COCKTAIL) SUSP suspension Take by mouth as needed for indigestion. Shake well.    . cetirizine (ZYRTEC) 10 MG tablet Take 10 mg by mouth daily.    . fluticasone (FLONASE) 50 MCG/ACT nasal spray Place 1 spray into both nostrils daily.    . pantoprazole (PROTONIX) 40 MG tablet Take 40 mg by mouth daily.     No current facility-administered medications for this visit.     GI cocktail has not helped very much.  ___________________________________________________________________ Objective   Exam:  BP (!) 146/86   Pulse 92   Ht 5\' 9"  (1.753 m)   Wt 262 lb 3.2 oz (118.9 kg)   BMI 38.72 kg/m    General: Well-appearing woman accompanied by husband and daughter.  Normal vocal quality  Eyes: sclera anicteric, no redness  ENT: oral mucosa moist without lesions, no cervical or supraclavicular lymphadenopathy  CV: RRR without murmur, S1/S2, no JVD, no peripheral edema  Resp: clear to auscultation bilaterally, normal RR and effort noted  GI: soft, upper abdominal tenderness, with active bowel sounds. No guarding or palpable organomegaly noted.  Skin; warm and dry, no rash or jaundice noted  Neuro: awake, alert and oriented x 3. Normal gross motor function and fluent speech  Labs:  Testing became available after the office visit and record request.  CT abdomen and pelvis 05/04/2018 shows normal CT abdomen, status post cholecystectomy Abdominal ultrasound 04/30/2018 increased hepatic echogenicity, status post cholecystectomy  Assessment: Encounter Diagnoses  Name Primary?  Marland Kitchen Upper  abdominal pain Yes  . Esophageal dysphagia   . Belching   . Abdominal bloating     Ongoing symptoms since acute illness 2 months ago, sounds likely to have been infectious at first.  Possible H. pylori, gastritis, ulcer, but seems most likely postinfectious dyspepsia.  Plan:  Upper endoscopy.  She is agreeable after discussion of procedure and risks.  The benefits and risks of the planned procedure were described in detail with the patient or (when appropriate) their health care proxy.  Risks were outlined as including, but not limited to, bleeding, infection, perforation, adverse medication reaction leading to cardiac or pulmonary decompensation.  The limitation of incomplete mucosal visualization was also discussed.  No guarantees or warranties were given.  Samples of FD gard given, 2 capsules twice daily.  Thank you for the courtesy of this consult.  Please call me with any questions or concerns.  Charlie Pitter III  CC: Referring provider  noted above

## 2018-05-29 NOTE — Patient Instructions (Signed)
If you are age 42 or older, your body mass index should be between 23-30. Your Body mass index is 38.72 kg/m. If this is out of the aforementioned range listed, please consider follow up with your Primary Care Provider.  If you are age 28 or younger, your body mass index should be between 19-25. Your Body mass index is 38.72 kg/m. If this is out of the aformentioned range listed, please consider follow up with your Primary Care Provider.   You have been scheduled for an endoscopy. Please follow written instructions given to you at your visit today. If you use inhalers (even only as needed), please bring them with you on the day of your procedure. Your physician has requested that you go to www.startemmi.com and enter the access code given to you at your visit today. This web site gives a general overview about your procedure. However, you should still follow specific instructions given to you by our office regarding your preparation for the procedure.  It was a pleasure to see you today!  Dr. Myrtie Neither

## 2018-06-04 ENCOUNTER — Ambulatory Visit (AMBULATORY_SURGERY_CENTER): Payer: BLUE CROSS/BLUE SHIELD | Admitting: Gastroenterology

## 2018-06-04 ENCOUNTER — Other Ambulatory Visit: Payer: Self-pay

## 2018-06-04 ENCOUNTER — Encounter: Payer: Self-pay | Admitting: Gastroenterology

## 2018-06-04 VITALS — BP 124/77 | HR 68 | Temp 98.6°F | Resp 13 | Ht 69.0 in | Wt 262.0 lb

## 2018-06-04 DIAGNOSIS — R1013 Epigastric pain: Secondary | ICD-10-CM | POA: Diagnosis present

## 2018-06-04 DIAGNOSIS — R110 Nausea: Secondary | ICD-10-CM | POA: Diagnosis not present

## 2018-06-04 MED ORDER — SODIUM CHLORIDE 0.9 % IV SOLN: 500.0000 mL | Freq: Once | INTRAVENOUS | Status: AC

## 2018-06-04 NOTE — Op Note (Signed)
[facility] Patient Name: [name] [name] Procedure Date: 06/04/2018 2:52 PM MRN: [id number] Endoscopist: Sherilyn Cooter L. Myrtie Neither , MD Age: 43 Referring MD:  Date of Birth: [birth year] Gender: Female Account #: [id number] Procedure:                Upper GI endoscopy Indications:              Epigastric abdominal pain, Eructation, Nausea                            (since acute UGI illness 2 months ago) Medicines:                Monitored Anesthesia Care Procedure:                Pre-Anesthesia Assessment:                           - Prior to the procedure, a History and Physical                            was performed, and patient medications and                            allergies were reviewed. The patient's tolerance of                            previous anesthesia was also reviewed. The risks                            and benefits of the procedure and the sedation                            options and risks were discussed with the patient.                            All questions were answered, and informed consent                            was obtained. Prior Anticoagulants: The patient has                            taken no previous anticoagulant or antiplatelet                            agents. ASA Grade Assessment: II - A patient with                            mild systemic disease. After reviewing the risks                            and benefits, the patient was deemed in                            satisfactory condition to undergo the procedure.  After obtaining informed consent, the endoscope was                            passed under direct vision. Throughout the                            procedure, the patient's blood pressure, pulse, and                            oxygen saturations were monitored continuously. The                            Model GIF-HQ190 [id number]) scope was introduced                            through the mouth,  and advanced to the second part                            of duodenum. The upper GI endoscopy was                            accomplished without difficulty. The patient                            tolerated the procedure well. Scope In: Scope Out: Findings:                 The esophagus was normal.                           The entire examined stomach was normal. Biopsies                            were taken with a cold forceps for Helicobacter                            pylori testing using CLOtest.                           The cardia and gastric fundus were normal on                            retroflexion.                           The examined duodenum was normal. Complications:            No immediate complications. Estimated Blood Loss:     Estimated blood loss: none. Impression:               - Normal esophagus.                           - Normal stomach. Biopsied.                           - Normal examined duodenum. Recommendation:           -  Patient has a contact number available for                            emergencies. The signs and symptoms of potential                            delayed complications were discussed with the                            patient. Return to normal activities tomorrow.                            Written discharge instructions were provided to the                            patient.                           - Resume previous diet.                           - Continue present medications.                           - Await pathology results. If negative for H                            pylori, trial of medicine for apparent                            post-infectious dyspepsia. Diane Mochizuki L. Myrtie Neither, MD 06/04/2018 3:12:38 PM This report has been signed electronically.

## 2018-06-04 NOTE — Patient Instructions (Signed)
YOU HAD AN ENDOSCOPIC PROCEDURE TODAY AT THE [facility]:   Refer to the procedure report that was given to you for any specific questions about what was found during the examination.  If the procedure report does not answer your questions, please call your gastroenterologist to clarify.  If you requested that your care partner not be given the details of your procedure findings, then the procedure report has been included in a sealed envelope for you to review at your convenience later.  YOU SHOULD EXPECT: Some feelings of bloating in the abdomen. Passage of more gas than usual.  Walking can help get rid of the air that was put into your GI tract during the procedure and reduce the bloating.   Please Note:  You might notice some irritation and congestion in your nose or some drainage.  This is from the oxygen used during your procedure.  There is no need for concern and it should clear up in a day or so.  SYMPTOMS TO REPORT IMMEDIATELY:   Following upper endoscopy (EGD)  Vomiting of blood or coffee ground material  New chest pain or pain under the shoulder blades  Painful or persistently difficult swallowing  New shortness of breath  Fever of 100F or higher  Black, tarry-looking stools  For urgent or emergent issues, a gastroenterologist can be reached at any hour by calling [phone].   DIET:  We do recommend a small meal at first, but then you may proceed to your regular diet.  Drink plenty of fluids but you should avoid alcoholic beverages for 24 hours.  ACTIVITY:  You should plan to take it easy for the rest of today and you should NOT DRIVE or use heavy machinery until tomorrow (because of the sedation medicines used during the test).    FOLLOW UP: Our staff will call the number listed on your records the next business day following your procedure to check on you and address any questions or concerns that you may have regarding the information given to you following  your procedure. If we do not reach you, we will leave a message.  However, if you are feeling well and you are not experiencing any problems, there is no need to return our call.  We will assume that you have returned to your regular daily activities without incident.  If any biopsies were taken you will be contacted by phone or by letter within the next 1-3 weeks.  Please call us at [phone] if you have not heard about the biopsies in 3 weeks.  If the hpylori test is p;ositive, we will call you.  You will have to be on antibiotics if it's positive.   SIGNATURES/CONFIDENTIALITY: You and/or your care partner have signed paperwork which will be entered into your electronic medical record.  These signatures attest to the fact that that the information above on your After Visit Summary has been reviewed and is understood.  Full responsibility of the confidentiality of this discharge information lies with you and/or your care-partner.

## 2018-06-04 NOTE — Progress Notes (Signed)
Report given to PACU, vss 

## 2018-06-04 NOTE — Progress Notes (Signed)
Called to room to assist during endoscopic procedure.  Patient ID and intended procedure confirmed with present staff. Received instructions for my participation in the procedure from the performing physician.  

## 2018-06-05 ENCOUNTER — Telehealth: Payer: Self-pay

## 2018-06-05 LAB — HELICOBACTER PYLORI SCREEN-BIOPSY: UREASE: NEGATIVE

## 2018-06-05 NOTE — Telephone Encounter (Signed)
  Follow up Call-  Call back number 06/04/2018  Post procedure Call Back phone  # [phone]  Permission to leave phone message Yes  Some recent data might be hidden     Patient questions:  Do you have a fever, pain , or abdominal swelling? No. Pain Score  0 *  Have you tolerated food without any problems? Yes.    Have you been able to return to your normal activities? Yes.    Do you have any questions about your discharge instructions: Diet   No. Medications  No. Follow up visit  No.  Do you have questions or concerns about your Care? No.  Actions: * If pain score is 4 or above: No action needed, pain <4.  Pt. States that she woke up with congestion and a sore throat this morning.  Advised that she probably was coming down with a cold prior to her procedure and she agreed.  Also advised to call us if her sore throat became worse and she agreed.

## 2018-06-07 ENCOUNTER — Other Ambulatory Visit: Payer: Self-pay | Admitting: Gastroenterology

## 2018-06-07 MED ORDER — BUSPIRONE HCL 7.5 MG PO TABS: 7.5000 mg | ORAL_TABLET | Freq: Every day | ORAL | 0 refills | Status: DC

## 2018-06-17 ENCOUNTER — Other Ambulatory Visit: Payer: Self-pay

## 2018-06-18 MED ORDER — BUSPIRONE HCL 7.5 MG PO TABS: 7.5000 mg | ORAL_TABLET | Freq: Every day | ORAL | 2 refills | Status: DC

## 2018-07-02 ENCOUNTER — Ambulatory Visit: Payer: Self-pay | Admitting: Gastroenterology

## 2019-08-02 ENCOUNTER — Other Ambulatory Visit: Payer: Self-pay | Admitting: Internal Medicine

## 2019-08-02 DIAGNOSIS — R102 Pelvic and perineal pain: Secondary | ICD-10-CM

## 2019-08-08 ENCOUNTER — Other Ambulatory Visit: Payer: Self-pay | Admitting: Internal Medicine

## 2019-08-08 ENCOUNTER — Ambulatory Visit
Admission: RE | Admit: 2019-08-08 | Discharge: 2019-08-08 | Disposition: A | Discharge status: Home / Self Care | Payer: BLUE CROSS/BLUE SHIELD | Source: Ambulatory Visit | Attending: Internal Medicine | Admitting: Internal Medicine

## 2019-08-08 DIAGNOSIS — R102 Pelvic and perineal pain: Secondary | ICD-10-CM

## 2019-09-23 ENCOUNTER — Other Ambulatory Visit: Payer: Self-pay | Admitting: Family Medicine

## 2019-09-23 DIAGNOSIS — Z1231 Encounter for screening mammogram for malignant neoplasm of breast: Secondary | ICD-10-CM

## 2019-10-02 ENCOUNTER — Ambulatory Visit (INDEPENDENT_AMBULATORY_CARE_PROVIDER_SITE_OTHER): Payer: 59 | Admitting: Obstetrics and Gynecology

## 2019-10-02 ENCOUNTER — Encounter: Payer: Self-pay | Admitting: Obstetrics and Gynecology

## 2019-10-02 ENCOUNTER — Other Ambulatory Visit (HOSPITAL_COMMUNITY)
Admission: RE | Admit: 2019-10-02 | Discharge: 2019-10-02 | Disposition: A | Discharge status: Home / Self Care | Payer: 59 | Source: Ambulatory Visit | Attending: Obstetrics and Gynecology | Admitting: Obstetrics and Gynecology

## 2019-10-02 VITALS — BP 134/82 | HR 105 | Ht 69.0 in | Wt 272.4 lb

## 2019-10-02 DIAGNOSIS — Z01419 Encounter for gynecological examination (general) (routine) without abnormal findings: Secondary | ICD-10-CM | POA: Insufficient documentation

## 2019-10-02 DIAGNOSIS — N888 Other specified noninflammatory disorders of cervix uteri: Secondary | ICD-10-CM

## 2019-10-02 NOTE — Patient Instructions (Addendum)
Exercising to Lose Weight  Exercise is structured, repetitive physical activity to improve fitness and health. Getting regular exercise is important for everyone. It is especially important if you are overweight. Being overweight increases your risk of heart disease, stroke, diabetes, high blood pressure, and several types of cancer. Reducing your calorie intake and exercising can help you lose weight. Exercise is usually categorized as moderate or vigorous intensity. To lose weight, most people need to do a certain amount of moderate-intensity or vigorous-intensity exercise each week.  Moderate-intensity exercise   Moderate-intensity exercise is any activity that gets you moving enough to burn at least three times more energy (calories) than if you were sitting. Examples of moderate exercise include:  Walking a mile in 15 minutes.  Doing light yard work.  Biking at an easy pace. Most people should get at least 150 minutes (2 hours and 30 minutes) a week of moderate-intensity exercise to maintain their body weight.  Vigorous-intensity exercise Vigorous-intensity exercise is any activity that gets you moving enough to burn at least six times more calories than if you were sitting. When you exercise at this intensity, you should be working hard enough that you are not able to carry on a conversation. Examples of vigorous exercise include:  Running.  Playing a team sport, such as football, basketball, and soccer.  Jumping rope. Most people should get at least 75 minutes (1 hour and 15 minutes) a week of vigorous-intensity exercise to maintain their body weight.  How can exercise affect me? When you exercise enough to burn more calories than you eat, you lose weight. Exercise also reduces body fat and builds muscle. The more muscle you have, the more calories you burn. Exercise also:  Improves mood.  Reduces stress and tension.  Improves your overall fitness, flexibility, and  endurance.  Increases bone strength.  The amount of exercise you need to lose weight depends on:  Your age.  The type of exercise.  Any health conditions you have.  Your overall physical ability. Talk to your health care provider about how much exercise you need and what types of activities are safe for you.  What actions can I take to lose weight? Nutrition  1. Make changes to your diet as told by your health care provider or diet and nutrition specialist (dietitian). This may include: ? Eating fewer calories. ? Eating more protein. ? Eating less unhealthy fats. ? Eating a diet that includes fresh fruits and vegetables, whole grains, low-fat dairy products, and lean protein. ? Avoiding foods with added fat, salt, and sugar. 2. Drink plenty of water while you exercise to prevent dehydration or heat stroke.  Activity 1. Choose an activity that you enjoy and set realistic goals. Your health care provider can help you make an exercise plan that works for you. 2. Exercise at a moderate or vigorous intensity most days of the week. ? The intensity of exercise may vary from person to person. You can tell how intense a workout is for you by paying attention to your breathing and heartbeat. Most people will notice their breathing and heartbeat get faster with more intense exercise. 3. Do resistance training twice each week, such as: ? Push-ups. ? Sit-ups. ? Lifting weights. ? Using resistance bands. 4. Getting short amounts of exercise can be just as helpful as long structured periods of exercise. If you have trouble finding time to exercise, try to include exercise in your daily routine. ? Get up, stretch, and walk around every 30   minutes throughout the day. ? Go for a walk during your lunch break. ? Park your car farther away from your destination. ? If you take public transportation, get off one stop early and walk the rest of the way. ? Make phone calls while standing up and  walking around. ? Take the stairs instead of elevators or escalators. 5. Wear comfortable clothes and shoes with good support. 6. Do not exercise so much that you hurt yourself, feel dizzy, or get very short of breath.  Where to find more information  [place] Department of Health and Human Services: www.hhs.gov  Centers for Disease Control and Prevention (CDC): www.cdc.gov  Contact a health care provider:  Before starting a new exercise program.  If you have questions or concerns about your weight.  If you have a medical problem that keeps you from exercising.  Get help right away if you have any of the following while exercising:  Injury.  Dizziness.  Difficulty breathing or shortness of breath that does not go away when you stop exercising.  Chest pain.  Rapid heartbeat.  Summary  Being overweight increases your risk of heart disease, stroke, diabetes, high blood pressure, and several types of cancer.  Losing weight happens when you burn more calories than you eat.  Reducing the amount of calories you eat in addition to getting regular moderate or vigorous exercise each week helps you lose weight.  This information is not intended to replace advice given to you by your health care provider. Make sure you discuss any questions you have with your health care provider. Document Revised: 04/03/2017 Document Reviewed: 04/03/2017 Elsevier Patient Education  2020 [employer]   Calorie Counting for Weight Loss  Calories are units of energy. Your body needs a certain amount of calories from food to keep you going throughout the day. When you eat more calories than your body needs, your body stores the extra calories as fat. When you eat fewer calories than your body needs, your body burns fat to get the energy it needs.  Calorie counting means keeping track of how many calories you eat and drink each day. Calorie counting can be helpful if you need to lose weight. If you make  sure to eat fewer calories than your body needs, you should lose weight. Ask your health care provider what a healthy weight is for you.  For calorie counting to work, you will need to eat the right number of calories in a day in order to lose a healthy amount of weight per week. A dietitian can help you determine how many calories you need in a day and will give you suggestions on how to reach your calorie goal.  A healthy amount of weight to lose per week is usually 1-2 lb (0.5-0.9 kg). This usually means that your daily calorie intake should be reduced by 500-750 calories.  Eating 1,200 - 1,500 calories per day can help most women lose weight.  Eating 1,500 - 1,800 calories per day can help most men lose weight.  What is my plan? My goal is to have __________ calories per day. If I have this many calories per day, I should lose around __________ pounds per week.  What do I need to know about calorie counting? In order to meet your daily calorie goal, you will need to:  Find out how many calories are in each food you would like to eat. Try to do this before you eat.  Decide how much of   the food you plan to eat.  Write down what you ate and how many calories it had. Doing this is called keeping a food log.  To successfully lose weight, it is important to balance calorie counting with a healthy lifestyle that includes regular activity. Aim for 150 minutes of moderate exercise (such as walking) or 75 minutes of vigorous exercise (such as running) each week.  Where do I find calorie information?  The number of calories in a food can be found on a Nutrition Facts label. If a food does not have a Nutrition Facts label, try to look up the calories online or ask your dietitian for help. Remember that calories are listed per serving. If you choose to have more than one serving of a food, you will have to multiply the calories per serving by the amount of servings you plan to eat. For example,  the label on a package of bread might say that a serving size is 1 slice and that there are 90 calories in a serving. If you eat 1 slice, you will have eaten 90 calories. If you eat 2 slices, you will have eaten 180 calories.  How do I keep a food log? Immediately after each meal, record the following information in your food log:  What you ate. Don't forget to include toppings, sauces, and other extras on the food.  How much you ate. This can be measured in cups, ounces, or number of items.  How many calories each food and drink had.  The total number of calories in the meal.  Keep your food log near you, such as in a small notebook in your pocket, or use a mobile app or website. Some programs will calculate calories for you and show you how many calories you have left for the day to meet your goal.  What are some calorie counting tips?  3. Use your calories on foods and drinks that will fill you up and not leave you hungry: ? Some examples of foods that fill you up are nuts and nut butters, vegetables, lean proteins, and high-fiber foods like whole grains. High-fiber foods are foods with more than 5 g fiber per serving. ? Drinks such as sodas, specialty coffee drinks, alcohol, and juices have a lot of calories, yet do not fill you up. 4. Eat nutritious foods and avoid empty calories. Empty calories are calories you get from foods or beverages that do not have many vitamins or protein, such as candy, sweets, and soda. It is better to have a nutritious high-calorie food (such as an avocado) than a food with few nutrients (such as a bag of chips). 5. Know how many calories are in the foods you eat most often. This will help you calculate calorie counts faster. 6. Pay attention to calories in drinks. Low-calorie drinks include water and unsweetened drinks. 7. Pay attention to nutrition labels for "low fat" or "fat free" foods. These foods sometimes have the same amount of calories or more  calories than the full fat versions. They also often have added sugar, starch, or salt, to make up for flavor that was removed with the fat. 8. Find a way of tracking calories that works for you. Get creative. Try different apps or programs if writing down calories does not work for you.  What are some portion control tips?  Know how many calories are in a serving. This will help you know how many servings of a certain food you can have.    Use a measuring cup to measure serving sizes. You could also try weighing out portions on a kitchen scale. With time, you will be able to estimate serving sizes for some foods.  Take some time to put servings of different foods on your favorite plates, bowls, and cups so you know what a serving looks like.  Try not to eat straight from a bag or box. Doing this can lead to overeating. Put the amount you would like to eat in a cup or on a plate to make sure you are eating the right portion.  Use smaller plates, glasses, and bowls to prevent overeating.  Try not to multitask (for example, watch TV or use your computer) while eating. If it is time to eat, sit down at a table and enjoy your food. This will help you to know when you are full. It will also help you to be aware of what you are eating and how much you are eating.  What are tips for following this plan? Reading food labels  Check the calorie count compared to the serving size. The serving size may be smaller than what you are used to eating.  Check the source of the calories. Make sure the food you are eating is high in vitamins and protein and low in saturated and trans fats.  Shopping  Read nutrition labels while you shop. This will help you make healthy decisions before you decide to purchase your food.  Make a grocery list and stick to it.  Cooking  Try to cook your favorite foods in a healthier way. For example, try baking instead of frying.  Use low-fat dairy products.  Meal  planning  Use more fruits and vegetables. Half of your plate should be fruits and vegetables.  Include lean proteins like poultry and fish.  How do I count calories when eating out? 7. Ask for smaller portion sizes. 8. Consider sharing an entree and sides instead of getting your own entree. 9. If you get your own entree, eat only half. Ask for a box at the beginning of your meal and put the rest of your entree in it so you are not tempted to eat it. 10. If calories are listed on the menu, choose the lower calorie options. 11. Choose dishes that include vegetables, fruits, whole grains, low-fat dairy products, and lean protein. 12. Choose items that are boiled, broiled, grilled, or steamed. Stay away from items that are buttered, battered, fried, or served with cream sauce. Items labeled "crispy" are usually fried, unless stated otherwise. 13. Choose water, low-fat milk, unsweetened iced tea, or other drinks without added sugar. If you want an alcoholic beverage, choose a lower calorie option such as a glass of wine or light beer. 14. Ask for dressings, sauces, and syrups on the side. These are usually high in calories, so you should limit the amount you eat. 15. If you want a salad, choose a garden salad and ask for grilled meats. Avoid extra toppings like bacon, cheese, or fried items. Ask for the dressing on the side, or ask for olive oil and vinegar or lemon to use as dressing. 16. Estimate how many servings of a food you are given. For example, a serving of cooked rice is  cup or about the size of half a baseball. Knowing serving sizes will help you be aware of how much food you are eating at restaurants. The list below tells you how big or small some common portion sizes are   based on everyday objects: ? 1 oz--4 stacked dice. ? 3 oz--1 deck of cards. ? 1 tsp--1 die. ? 1 Tbsp-- a ping-pong ball. ? 2 Tbsp--1 ping-pong ball. ?  cup-- baseball. ? 1 cup--1 baseball.  Summary  Calorie  counting means keeping track of how many calories you eat and drink each day. If you eat fewer calories than your body needs, you should lose weight.  A healthy amount of weight to lose per week is usually 1-2 lb (0.5-0.9 kg). This usually means reducing your daily calorie intake by 500-750 calories.  The number of calories in a food can be found on a Nutrition Facts label. If a food does not have a Nutrition Facts label, try to look up the calories online or ask your dietitian for help.  Use your calories on foods and drinks that will fill you up, and not on foods and drinks that will leave you hungry.  Use smaller plates, glasses, and bowls to prevent overeating.  This information is not intended to replace advice given to you by your health care provider. Make sure you discuss any questions you have with your health care provider. Document Revised: 12/08/2017 Document Reviewed: 02/19/2016 Elsevier Patient Education  2020 [employer]   

## 2019-10-02 NOTE — Progress Notes (Signed)
PATIENT ID: [name] [name], female     DOB: 1975-07-01, 44 y.o.     MRN: [id number]   [facility] Visit  10/02/19     PATIENT NAME: [name] [name]     MRN [id number]     DOB: 03-Apr-1976  CC & HPI:   Chief Complaint  Patient presents with  . Pelvc Pain    Cyst   [name] [name] is a 44 y.o. female presenting today to discuss options regarding her recently found ovarian cyst. She presented to [facility] s/p ER visit with complaints of moderate abdominal pain for over one month. She completed a TA/TV U/s of the pelvis on 08/08/2019 revealing a right ovary of 5.5 x 2.3 x 2.4 cm; Dominant follicles without mass. They also revealed multiple nabothian cysts at cervix.   She states that her periods are normal, generally 5 days long. She states that three days are heavy, and two days are lighter. She states that she does begin to develop symptoms of a period two weeks prior generally, including mood swings, hot flashes, and night sweats. She does not have any current birth control, and she notes that she has not been sexually active recently with her abdominal pain and pain during intercourse.   She has one child birthed via cesarean. She does have some pain in her inner thighs that radiates from her cesarean scar.   ROS:  Review of Systems  Constitutional: Negative.   HENT: Negative.   Eyes: Negative.   Respiratory: Negative.   Cardiovascular: Negative.   Gastrointestinal: Positive for abdominal pain (mild) and blood in stool (bright red).  Genitourinary: Negative.   Musculoskeletal: Positive for back pain.  Skin: Negative.   Neurological: Negative.   Endo/Heme/Allergies: Negative.   Psychiatric/Behavioral: Negative.   All other systems reviewed and are negative. ROS: She has had an onset of breast pain gradually for the last several years. She has an upcoming mammogram on 10/15/2019.   08/08/2019 Pelvis TA/TV Uterus  Measurements: 9.3 x 4.5 x 4.7 cm =  volume: 102 mL. Anteverted. Multiple nabothian cysts at cervix. Mildly heterogeneous myometrium without focal mass. Endometrium  Thickness: 10 mm.  No endometrial fluid or focal abnormality Right ovary  Measurements: 5.5 x 2.3 x 2.4 cm = volume: 15.7 mL. Dominant follicles without mass Left ovary   Measurements: 4.3 x 2.1 x 2.1 cm = volume: 9.7 mL. Normal morphology without mass Other findings  No free pelvic fluid.  No adnexal masses.  Pertinent History Reviewed:  Reviewed:  Medical         Past Medical History:  Diagnosis Date  . Acid reflux   . Anxiety   . Arthritis   . Esophageal stricture   . Gallstones   . History of bladder problems                               Surgical Hx:    Past Surgical History:  Procedure Laterality Date  . ABDOMINAL EXPLORATION SURGERY     as a child  . CESAREAN SECTION  2007  . LAPAROSCOPIC CHOLECYSTECTOMY    . TIBIA FRACTURE SURGERY Right 2009   Medications: Reviewed & Updated - see associated section                       Current Outpatient Medications:  .  acetaminophen (TYLENOL) 500 MG tablet, Take 1,000 mg by  mouth every 6 (six) hours as needed., Disp: , Rfl:  .  amitriptyline (ELAVIL) 25 MG tablet, Take 25 mg by mouth at bedtime., Disp: , Rfl:  .  loratadine (CLARITIN) 10 MG tablet, Take 10 mg by mouth daily., Disp: , Rfl:  .  pantoprazole (PROTONIX) 40 MG tablet, Take 40 mg by mouth daily., Disp: , Rfl:  .  sertraline (ZOLOFT) 50 MG tablet, Take 50 mg by mouth daily., Disp: , Rfl:  .  traMADol (ULTRAM) 50 MG tablet, Take by mouth every 6 (six) hours as needed., Disp: , Rfl:   Current Facility-Administered Medications:  .  0.9 %  sodium chloride infusion, 500 mL, Intravenous, Once, Danis, Andreas Blower, MD   Social History: Reviewed -  reports that she has never smoked. She has never used smokeless tobacco.  Objective Findings:  Vitals: Blood pressure 134/82, pulse (!) 105, height 5\' 9"  (1.753 m), weight 272 lb 6.4 oz (123.6  kg), last menstrual period 09/11/2019.  PHYSICAL EXAMINATION General appearance - alert, well appearing, and in no distress, oriented to person, place, and time, overweight and well hydrated Mental status - alert, oriented to person, place, and time, normal mood, behavior, speech, dress, motor activity, and thought processes, affect appropriate to mood Chest - not examined Heart - not examined Abdomen - not examined Breasts - not examined Skin - normal coloration and turgor, no rashes, no suspicious skin lesions noted  PELVIC External genitalia - normal female Vulva - normal  Vagina - normal  Cervix - high, midposition, well supported. Uterus - normal size shape   Adnexa - negative for masses palpable [place] - n/a Rectal - rectal exam not indicated Will give pt hemoccult cards. Assessment & Plan:   A:  Ovarian cyst on u/s consistent with normal ovulation processes, Nabothian cysts = normal process P:  1.  processes of ovulation, nabothian cyst formation explained To.  We will send Hemoccult cards home with patient x3  By signing my name below, I, 11/11/2019, attest that this documentation has been prepared under the direction and in the presence of [employer], MD. Electronically Signed: Tilda Burrow Medical Scribe. 10/02/19. 8:44 AM.  I personally performed the services described in this documentation, which was SCRIBED in my presence. The recorded information has been reviewed and considered accurate. It has been edited as necessary during review. 10/04/19, MD

## 2019-10-03 LAB — CYTOLOGY - PAP
Chlamydia: NEGATIVE
Comment: NEGATIVE
Comment: NEGATIVE
Comment: NORMAL
Diagnosis: NEGATIVE
High risk HPV: NEGATIVE
Neisseria Gonorrhea: NEGATIVE

## 2019-10-05 NOTE — Progress Notes (Signed)
GC,CHL, Hpv all negative.

## 2019-10-08 ENCOUNTER — Other Ambulatory Visit (INDEPENDENT_AMBULATORY_CARE_PROVIDER_SITE_OTHER): Payer: 59

## 2019-10-08 DIAGNOSIS — Z1211 Encounter for screening for malignant neoplasm of colon: Secondary | ICD-10-CM | POA: Diagnosis not present

## 2019-10-08 LAB — HEMOCCULT GUIAC POC 1CARD (OFFICE)
Card #2 Fecal Occult Blod, POC: NEGATIVE
Card #3 Fecal Occult Blood, POC: NEGATIVE
Fecal Occult Blood, POC: NEGATIVE

## 2019-10-15 ENCOUNTER — Ambulatory Visit: Payer: 59

## 2019-10-23 ENCOUNTER — Ambulatory Visit
Admission: RE | Admit: 2019-10-23 | Discharge: 2019-10-23 | Disposition: A | Discharge status: Home / Self Care | Payer: 59 | Source: Ambulatory Visit | Attending: Family Medicine | Admitting: Family Medicine

## 2019-10-23 ENCOUNTER — Other Ambulatory Visit: Payer: Self-pay

## 2019-10-23 DIAGNOSIS — Z1231 Encounter for screening mammogram for malignant neoplasm of breast: Secondary | ICD-10-CM

## 2019-10-23 HISTORY — DX: Diffuse cystic mastopathy of unspecified breast: N60.19

## 2020-09-22 ENCOUNTER — Other Ambulatory Visit: Payer: Self-pay | Admitting: Family Medicine

## 2020-09-22 DIAGNOSIS — Z1231 Encounter for screening mammogram for malignant neoplasm of breast: Secondary | ICD-10-CM

## 2020-10-19 ENCOUNTER — Other Ambulatory Visit: Payer: 59 | Admitting: Obstetrics & Gynecology

## 2020-11-13 DIAGNOSIS — Z1231 Encounter for screening mammogram for malignant neoplasm of breast: Secondary | ICD-10-CM

## 2020-11-30 ENCOUNTER — Other Ambulatory Visit: Payer: 59 | Admitting: Obstetrics & Gynecology

## 2020-12-25 ENCOUNTER — Other Ambulatory Visit: Payer: 59 | Admitting: Obstetrics & Gynecology

## 2021-08-11 ENCOUNTER — Ambulatory Visit
Admission: RE | Admit: 2021-08-11 | Discharge: 2021-08-11 | Disposition: A | Discharge status: Home / Self Care | Payer: BC Managed Care – PPO | Source: Ambulatory Visit | Attending: Internal Medicine | Admitting: Internal Medicine

## 2021-08-11 ENCOUNTER — Other Ambulatory Visit: Payer: Self-pay | Admitting: Internal Medicine

## 2021-08-11 DIAGNOSIS — Z1231 Encounter for screening mammogram for malignant neoplasm of breast: Secondary | ICD-10-CM

## 2022-11-15 ENCOUNTER — Other Ambulatory Visit: Payer: Self-pay | Admitting: Internal Medicine

## 2022-11-15 DIAGNOSIS — Z1231 Encounter for screening mammogram for malignant neoplasm of breast: Secondary | ICD-10-CM

## 2022-12-13 ENCOUNTER — Inpatient Hospital Stay: Admission: RE | Admit: 2022-12-13 | Payer: BC Managed Care – PPO | Source: Ambulatory Visit

## 2022-12-20 ENCOUNTER — Ambulatory Visit
Admission: RE | Admit: 2022-12-20 | Discharge: 2022-12-20 | Disposition: A | Discharge status: Home / Self Care | Payer: Medicaid Other | Source: Ambulatory Visit | Attending: Internal Medicine | Admitting: Internal Medicine

## 2022-12-20 ENCOUNTER — Other Ambulatory Visit: Payer: Self-pay | Admitting: Family Medicine

## 2022-12-20 DIAGNOSIS — Z1231 Encounter for screening mammogram for malignant neoplasm of breast: Secondary | ICD-10-CM

## 2022-12-23 ENCOUNTER — Other Ambulatory Visit: Payer: Self-pay | Admitting: Family Medicine

## 2022-12-23 DIAGNOSIS — R928 Other abnormal and inconclusive findings on diagnostic imaging of breast: Secondary | ICD-10-CM

## 2022-12-26 IMAGING — MG MM DIGITAL SCREENING BILAT W/ TOMO AND CAD
8 series · 8 of 24 positions shown · non-contrast
Comparison: Previous exam(s).

CLINICAL DATA: Screening.

EXAM:
DIGITAL SCREENING BILATERAL MAMMOGRAM WITH TOMOSYNTHESIS AND CAD
TECHNIQUE: Bilateral screening digital craniocaudal and mediolateral oblique
mammograms were obtained. Bilateral screening digital breast
tomosynthesis was performed. The images were evaluated with
computer-aided detection.

[L CC synth-2D]
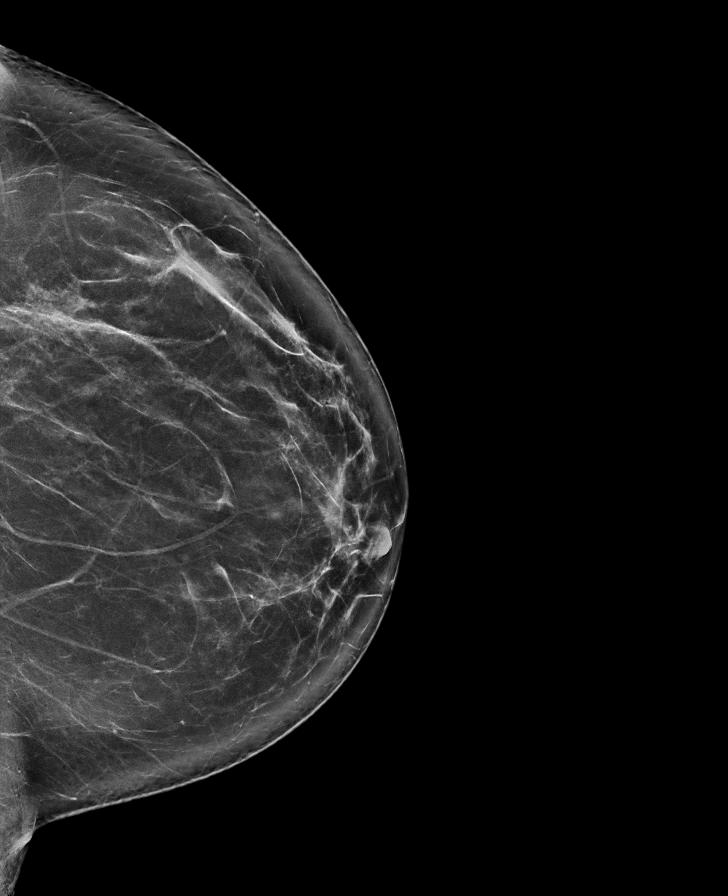

[R MLO synth-2D]
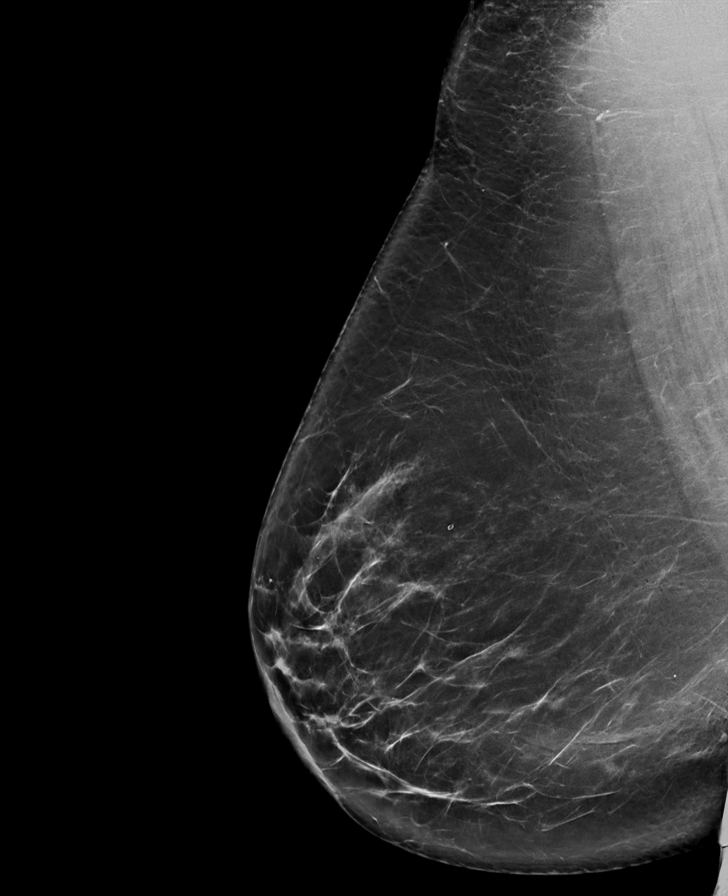

[R CC synth-2D]
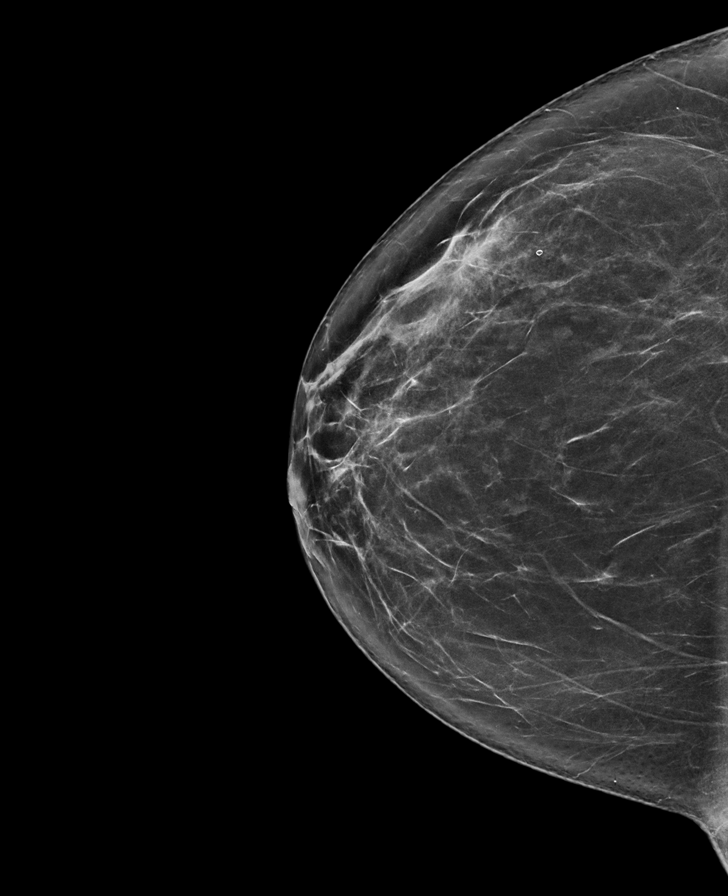

[L MLO synth-2D]
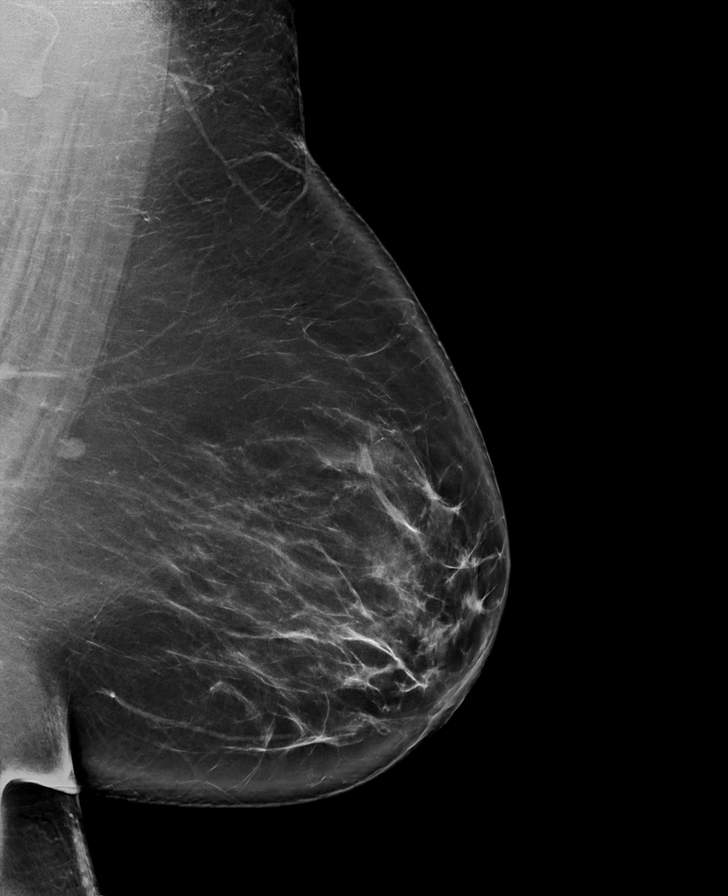

[R MLO tomo · tomo slice 49/96.0]
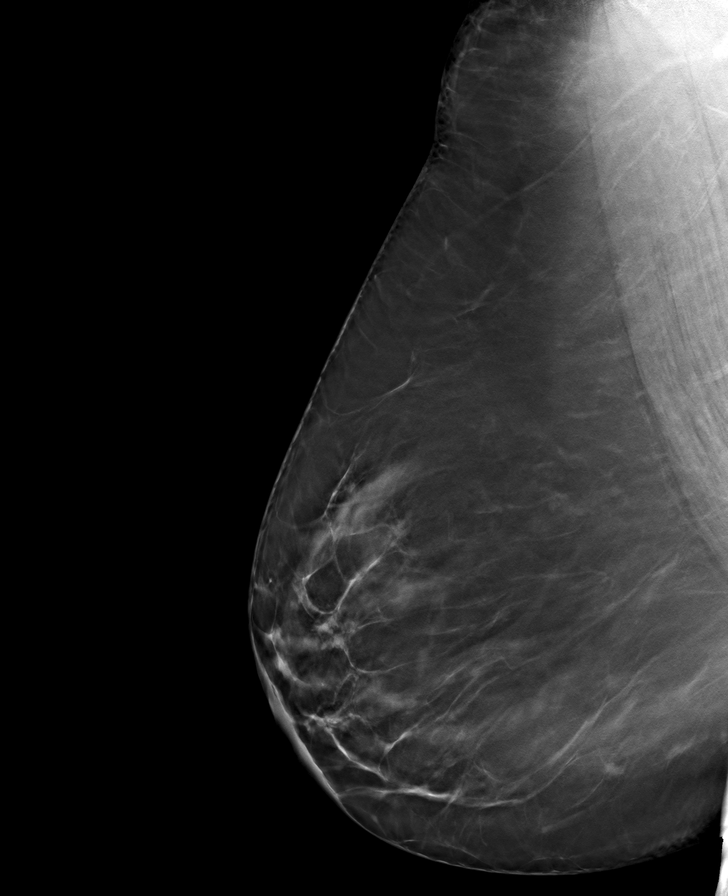

[L CC tomo · tomo slice 44/87.0]
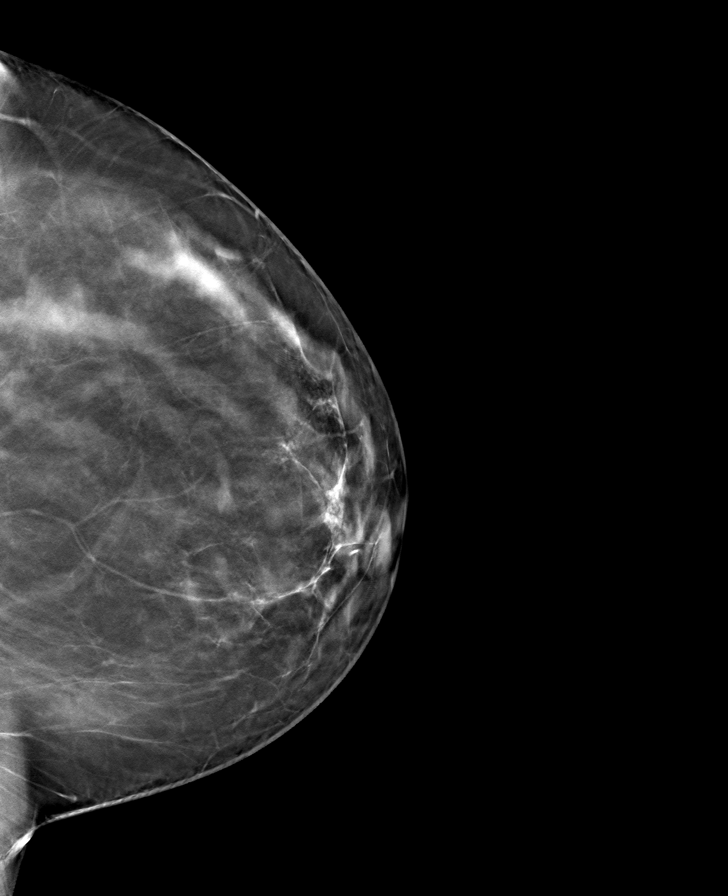

[L MLO tomo · tomo slice 52/103.0]
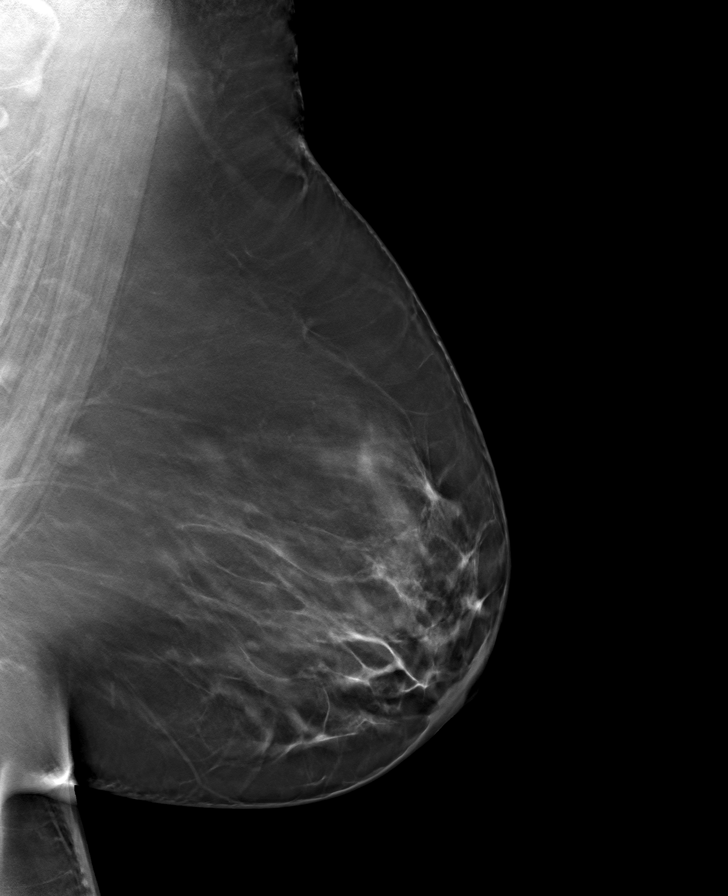

[R CC tomo · tomo slice 41/80.0]
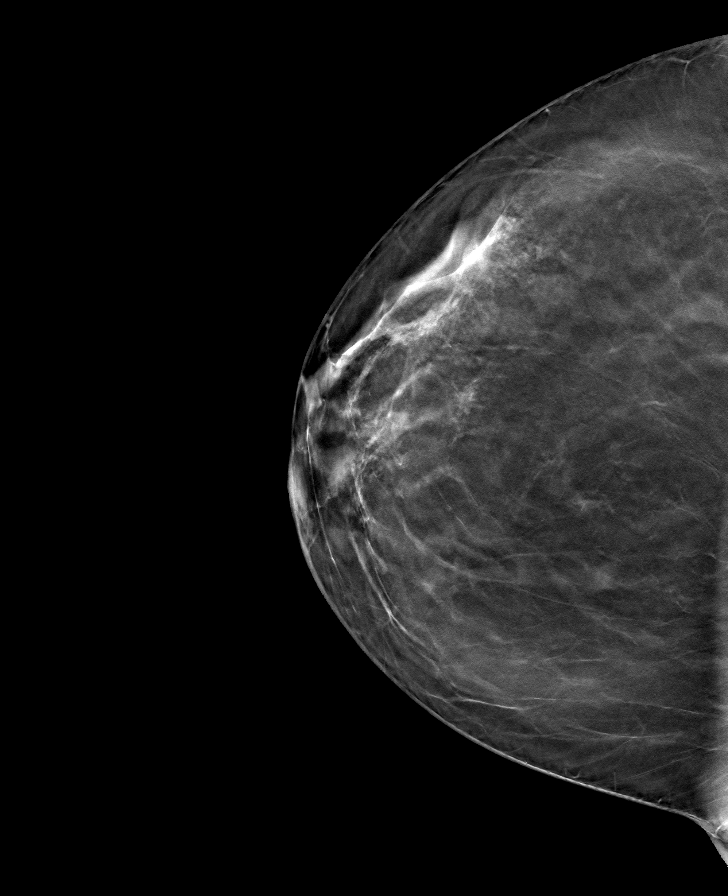

[8 of 24 positions shown; findings below may reference images not displayed]

ACR Breast Density Category b: There are scattered areas of
fibroglandular density.
FINDINGS: There are no findings suspicious for malignancy.
IMPRESSION: No mammographic evidence of malignancy. A result letter of this
screening mammogram will be mailed directly to the patient.

RECOMMENDATION:
Screening mammogram in one year. (Code:51-O-LD2)

BI-RADS CATEGORY  1: Negative.

## 2022-12-29 ENCOUNTER — Ambulatory Visit
Admission: RE | Admit: 2022-12-29 | Discharge: 2022-12-29 | Disposition: A | Discharge status: Home / Self Care | Payer: Medicaid Other | Source: Ambulatory Visit | Attending: Family Medicine | Admitting: Family Medicine

## 2022-12-29 DIAGNOSIS — R928 Other abnormal and inconclusive findings on diagnostic imaging of breast: Secondary | ICD-10-CM

## 2023-12-13 ENCOUNTER — Other Ambulatory Visit: Payer: Self-pay | Admitting: Internal Medicine

## 2023-12-13 DIAGNOSIS — Z1231 Encounter for screening mammogram for malignant neoplasm of breast: Secondary | ICD-10-CM

## 2023-12-26 ENCOUNTER — Other Ambulatory Visit: Payer: Self-pay | Admitting: Family Medicine

## 2023-12-26 ENCOUNTER — Ambulatory Visit
Admission: RE | Admit: 2023-12-26 | Discharge: 2023-12-26 | Disposition: A | Discharge status: Home / Self Care | Source: Ambulatory Visit | Attending: Internal Medicine | Admitting: Internal Medicine

## 2023-12-26 DIAGNOSIS — Z1231 Encounter for screening mammogram for malignant neoplasm of breast: Secondary | ICD-10-CM
